# Patient Record
Sex: Male | Born: 1995 | Race: White | Hispanic: No | Marital: Single | Smoking: Current some day smoker
Health system: Southern US, Community
[De-identification: ages and names within clinical notes are randomized; demographics above are authoritative.]

---
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
## 2017-07-25 IMAGING — CR DG CHEST 2V
1 series · 2 of 2 positions shown · non-contrast
Comparison: None.

CLINICAL DATA: Cough and fever for the past 3 weeks. History of
smoking.

EXAM:
CHEST  2 VIEW

[Series 1: dg chest 2 view · 0.14mm/px · 2 of 2 slices shown]
[im 1/2]
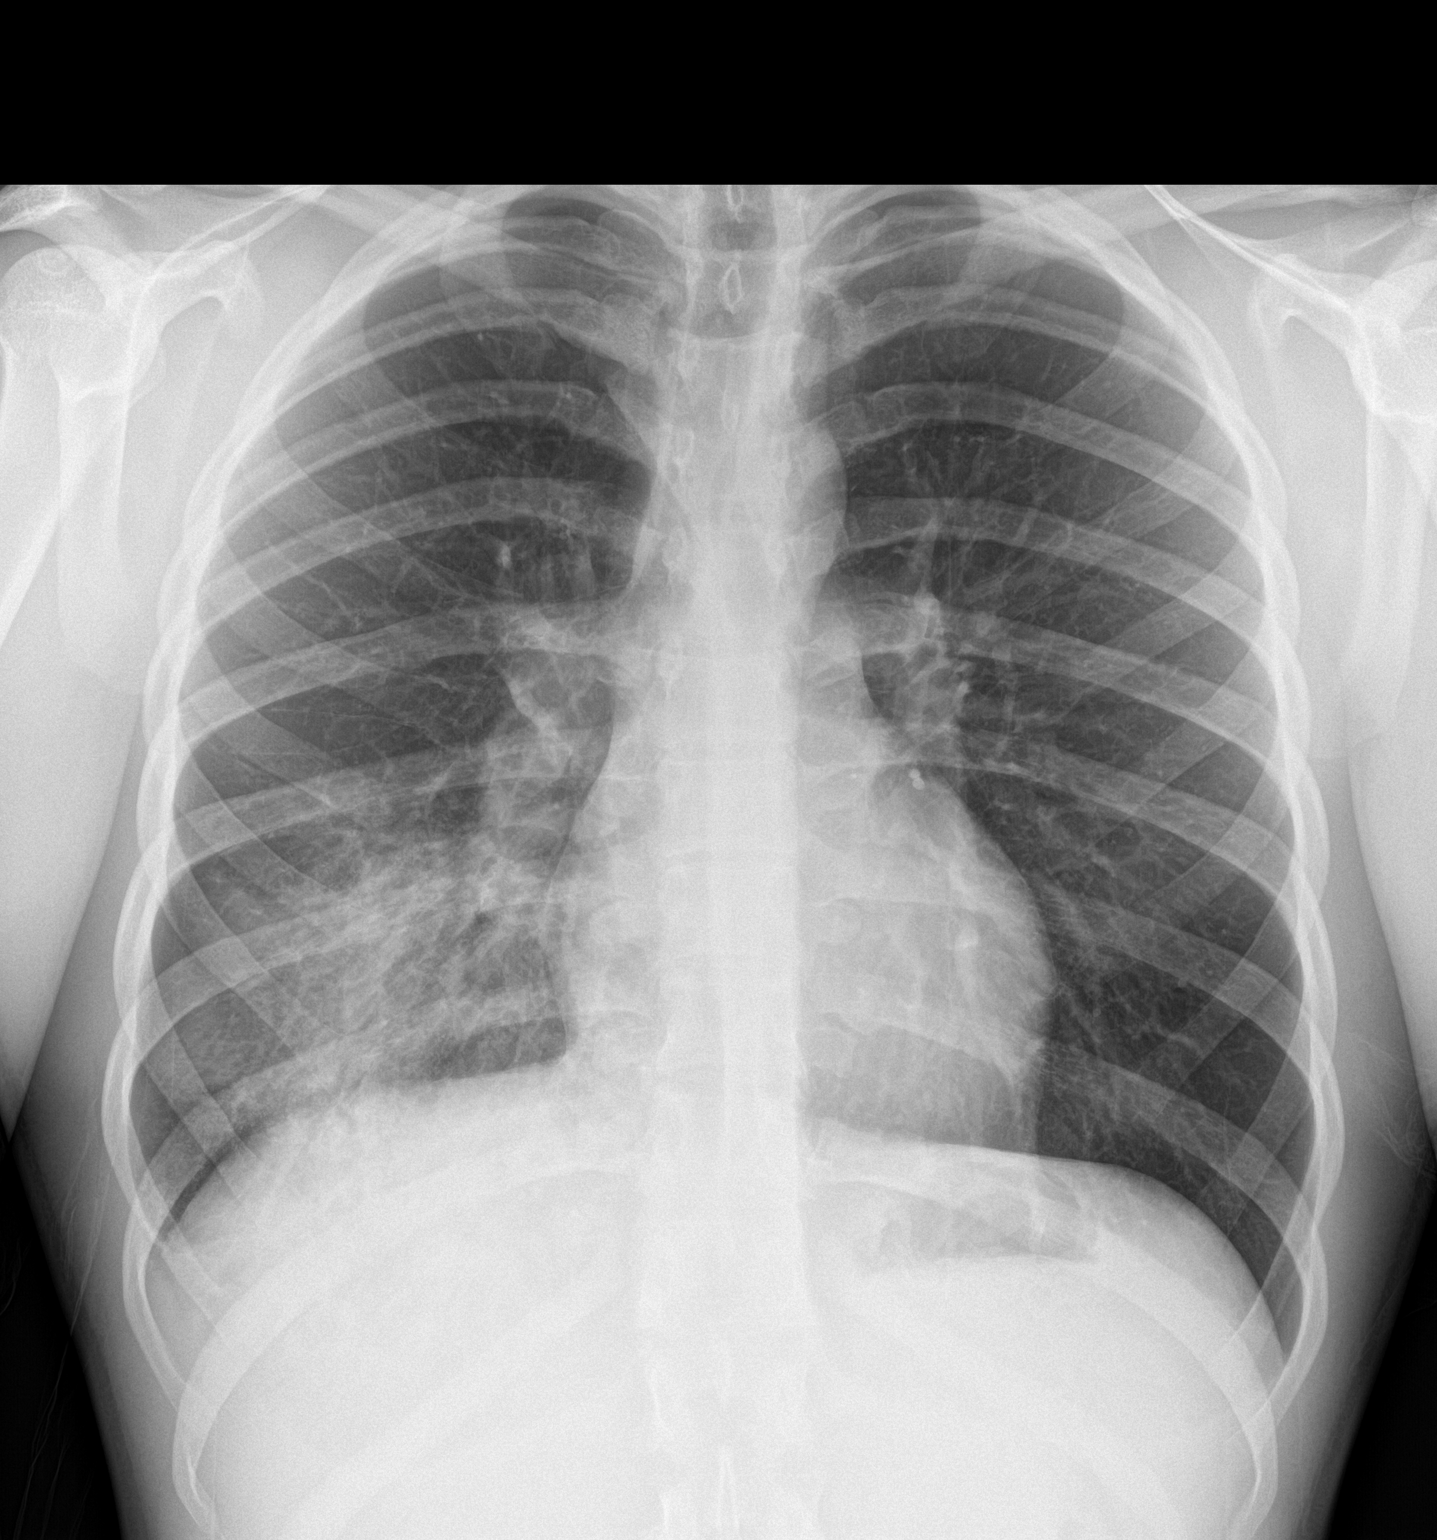
[im 2/2]
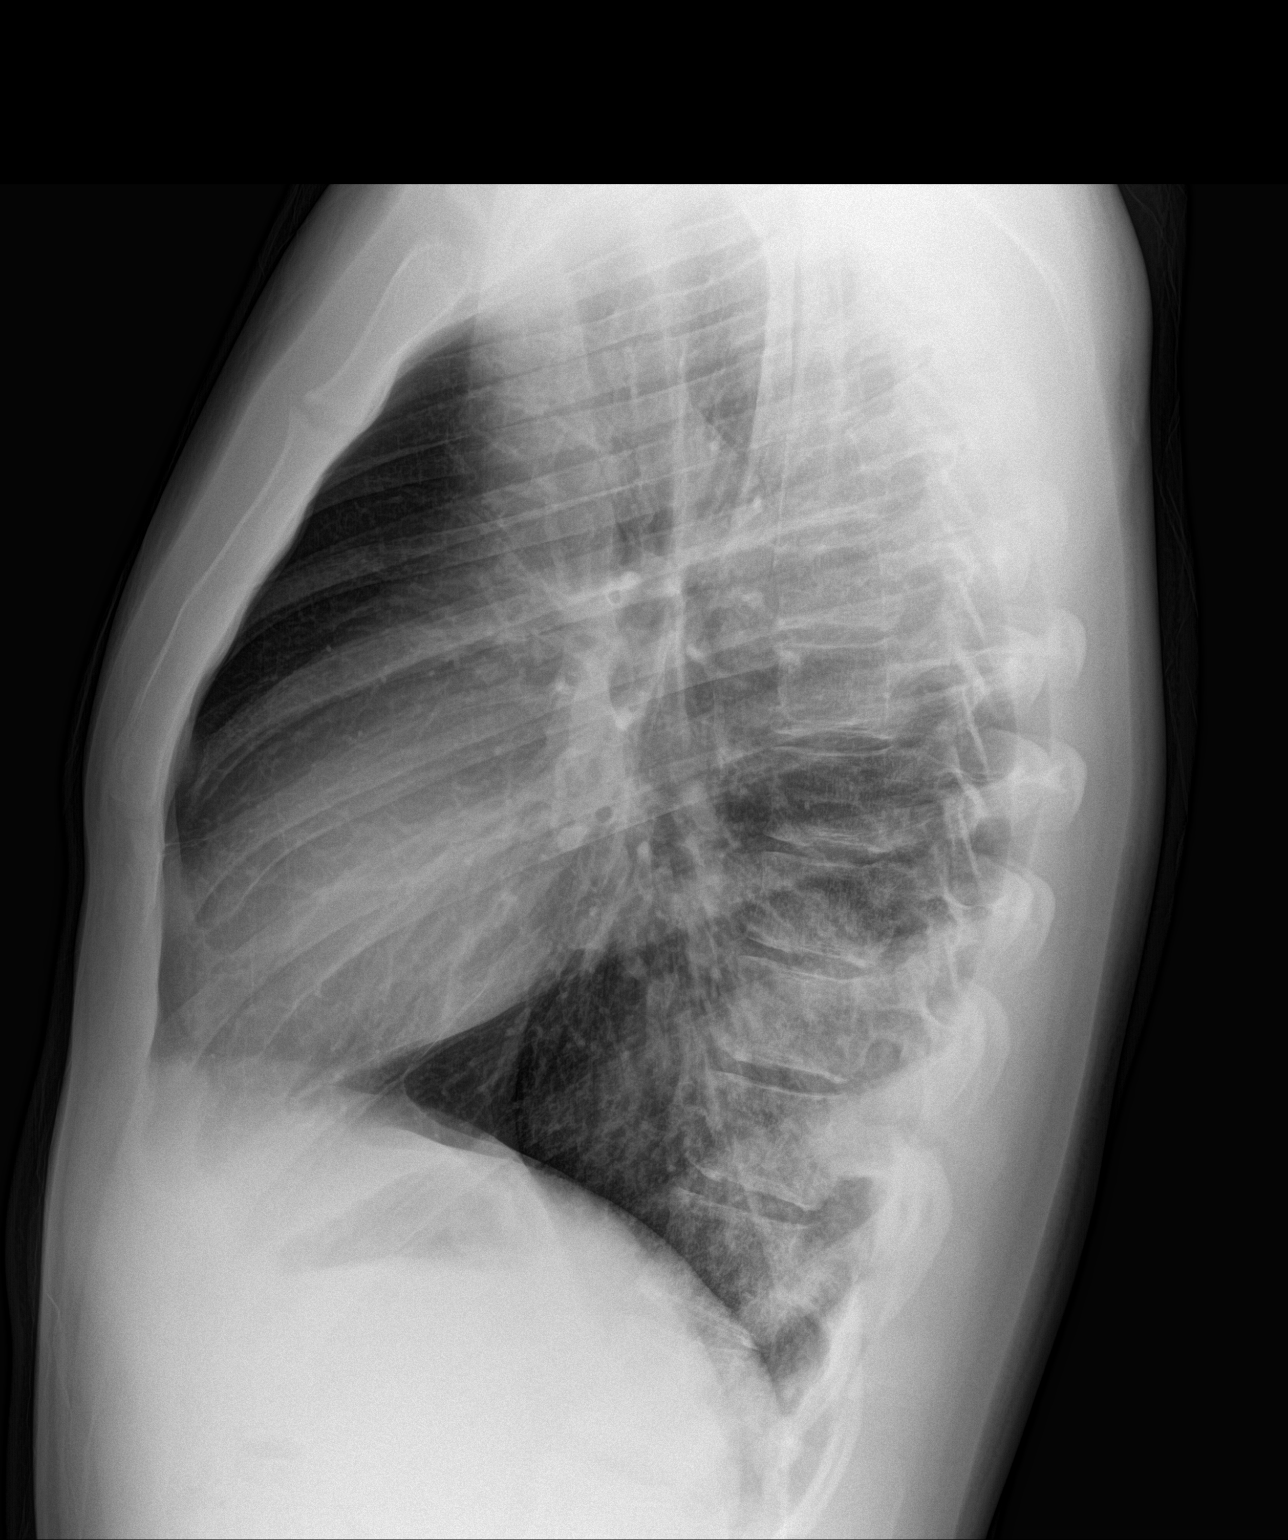

[2 of 2 positions shown; findings below may reference images not displayed]

FINDINGS: Normal cardiac silhouette and mediastinal contours. Ill-defined
heterogeneous airspace opacities within the right lower lobe. No
pleural effusion or pneumothorax. No evidence of edema. No acute
osseus abnormalities.
IMPRESSION: Findings worrisome for right lower lobe pneumonia.
